# Patient Record
Sex: Male | Born: 1990 | Race: White | Hispanic: No | Smoking: Former smoker
Health system: Southern US, Community
[De-identification: ages and names within clinical notes are randomized; demographics above are authoritative.]

## PROBLEM LIST (undated history)

## (undated) DIAGNOSIS — T7840XA Allergy, unspecified, initial encounter: Secondary | ICD-10-CM

## (undated) HISTORY — DX: Allergy, unspecified, initial encounter: T78.40XA

---
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
## 2012-10-29 ENCOUNTER — Ambulatory Visit (INDEPENDENT_AMBULATORY_CARE_PROVIDER_SITE_OTHER): Payer: BC Managed Care – PPO | Admitting: Physician Assistant

## 2012-10-29 VITALS — BP 122/76 | HR 106 | Temp 98.1°F | Resp 16 | Ht 74.0 in | Wt 138.2 lb

## 2012-10-29 DIAGNOSIS — R42 Dizziness and giddiness: Secondary | ICD-10-CM

## 2012-10-29 NOTE — Progress Notes (Signed)
  Subjective:    Patient ID: Willie Browning, male    DOB: [birth year], 22 y.o.   MRN: [id number]  HPI 22 year old male presents for return to work note. He works at a Therapist, occupational and was working in the heat and felt slightly dizzy like the room was spinning. He sat down and drank a gatorade and ate a pack of crackers and immediately felt better.  He had no LOC, did not pass out, no headache, vision changes, nausea, or vomiting.  He reports complete resolution of symptoms and is asymptomatic now.  His employer sent him home and will not let him return to work until he has a note releasing him.  He is otherwise healthy with no known medical problems.     Review of Systems  Constitutional: Negative for fever and chills.  Gastrointestinal: Negative for nausea and vomiting.  Neurological: Negative for dizziness, syncope, weakness and headaches.       Objective:   Physical Exam  Constitutional: He is oriented to person, place, and time. He appears well-developed and well-nourished.  HENT:  Head: Normocephalic and atraumatic.  Right Ear: External ear normal.  Left Ear: External ear normal.  Eyes: Conjunctivae and EOM are normal. Pupils are equal, round, and reactive to light.  Neck: Normal range of motion. Neck supple.  Cardiovascular: Normal rate, regular rhythm and normal heart sounds.   Pulmonary/Chest: Effort normal and breath sounds normal.  Lymphadenopathy:    He has no cervical adenopathy.  Neurological: He is alert and oriented to person, place, and time.  Psychiatric: He has a normal mood and affect. His behavior is normal. Judgment and thought content normal.          Assessment & Plan:   Dizziness and giddiness  Patient has had complete resolution of symptoms. Dizziness likely due to heat intolerance.  Discussed need to stay hydrated, especially over the next few days with the high temperatures.  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  RTC precautions including return of  dizziness, LOC, vision changes, nausea, or vomiting.

## 2016-04-18 ENCOUNTER — Ambulatory Visit (INDEPENDENT_AMBULATORY_CARE_PROVIDER_SITE_OTHER): Payer: Self-pay | Admitting: Urgent Care

## 2016-04-18 VITALS — BP 110/70 | HR 66 | Temp 98.4°F | Resp 16 | Ht 73.25 in | Wt 161.4 lb

## 2016-04-18 DIAGNOSIS — Z024 Encounter for examination for driving license: Secondary | ICD-10-CM

## 2016-04-18 NOTE — Patient Instructions (Addendum)

## 2016-04-18 NOTE — Progress Notes (Signed)
Commercial Driver Medical Examination   Willie MartRobert L Kinser is a 25 y.o. male who presents today for a DOT physical exam. The patient reports he used to smoke 1ppd. Denies alcohol use. Denies dizziness, chronic headache, blurred vision, chest pain, shortness of breath, heart racing, palpitations, nausea, vomiting, abdominal pain, hematuria, lower leg swelling.   The following portions of the patient's history were reviewed and updated as appropriate: allergies, current medications, past family history, past medical history, past social history and past surgical history.  Objective:   BP 110/70 (BP Location: Right Arm, Cuff Size: Normal)   Pulse 66   Temp 98.4 F (36.9 C) (Oral)   Resp 16   Ht 6' 1.25" (1.861 m)   Wt 161 lb 6.4 oz (73.2 kg)   SpO2 100%   BMI 21.15 kg/m   Vision/hearing:  Visual Acuity Screening   Right eye Left eye Both eyes  Without correction: 20/13 20/13 20/13   With correction:     Comments: The patient can distinguish the colors red, amber and green. Peripheral Vision: Right eye 85 degrees. Left eye 85 degrees.  Hearing Screening Comments: The patient was able to hear a forced whisper from 10 feet.  Patient can recognize and distinguish among traffic control signals and devices showing standard red, green, and amber colors.  Corrective lenses required: No  Monocular Vision?: No  Hearing aid requirement: No  Physical Exam  Constitutional: He is oriented to person, place, and time. He appears well-developed and well-nourished.  HENT:  TM's intact bilaterally, no effusions or erythema. Nasal turbinates pink and moist, nasal passages patent. No sinus tenderness. Oropharynx clear, mucous membranes moist, dentition in good repair.  Eyes: Conjunctivae and EOM are normal. Pupils are equal, round, and reactive to light. Right eye exhibits no discharge. Left eye exhibits no discharge. No scleral icterus.  Neck: Normal range of motion. Neck supple. No thyromegaly  present.  Cardiovascular: Normal rate, regular rhythm and intact distal pulses.  Exam reveals no gallop and no friction rub.   No murmur heard. Pulmonary/Chest: No stridor. No respiratory distress. He has no wheezes. He has no rales.  Abdominal: Soft. Bowel sounds are normal. He exhibits no distension and no mass. There is no tenderness.  Musculoskeletal: Normal range of motion. He exhibits no edema or tenderness.  Lymphadenopathy:    He has no cervical adenopathy.  Neurological: He is alert and oriented to person, place, and time. He has normal reflexes.  Skin: Skin is warm and dry. No rash noted. No erythema. No pallor.  Psychiatric: He has a normal mood and affect.   Labs: Comments: UA Prot. zero; Blood zero;  SG 1.015; Gluc. zero.  Assessment:    Healthy male exam.  Meets standards in 449 CFR 391.41;  qualifies for 2 year certificate.    Plan:   Medical examiners certificate completed and printed. Return as needed.

## 2017-02-12 ENCOUNTER — Emergency Department (HOSPITAL_COMMUNITY)
Admission: EM | Admit: 2017-02-12 | Discharge: 2017-02-13 | Disposition: A | Discharge status: Home / Self Care | Payer: 59 | Attending: Emergency Medicine | Admitting: Emergency Medicine

## 2017-02-12 ENCOUNTER — Emergency Department (HOSPITAL_COMMUNITY): Payer: 59

## 2017-02-12 ENCOUNTER — Encounter (HOSPITAL_COMMUNITY): Payer: Self-pay

## 2017-02-12 DIAGNOSIS — R600 Localized edema: Secondary | ICD-10-CM | POA: Diagnosis present

## 2017-02-12 DIAGNOSIS — L02512 Cutaneous abscess of left hand: Secondary | ICD-10-CM | POA: Insufficient documentation

## 2017-02-12 DIAGNOSIS — Z87891 Personal history of nicotine dependence: Secondary | ICD-10-CM | POA: Insufficient documentation

## 2017-02-12 MED ORDER — BUPIVACAINE HCL (PF) 0.5 % IJ SOLN
10.0000 mL | Freq: Once | INTRAMUSCULAR | Status: AC
  Administered 2017-02-13: 10 mL
  Filled 2017-02-12: qty 30

## 2017-02-12 NOTE — ED Notes (Signed)
Bed: WTR7 Expected date:  Expected time:  Means of arrival:  Comments: 

## 2017-02-12 NOTE — ED Triage Notes (Signed)
Pt complains of left middle finger soreness, he said last year he had metal in there and received antibiotics Pt doesn't think anything is in there this time He states it's swollen and sore and feels infected

## 2017-02-12 NOTE — ED Provider Notes (Signed)
[facility]-EMERGENCY DEPT Provider Note   CSN: [id number] Arrival date & time: 02/12/17  2147     History   Chief Complaint Chief Complaint  Patient presents with  . Hand Pain    HPI Willie Browning is a 26 y.o. male who presents emergency Department with chief complaint of left finger tip infection. Previous history of foreign body in the finger which required incision and drainage. Patient states that this is about 2 years ago. About 3 days ago he began having swelling in the distal part of the finger pad of his left middle finger. He denies any pain with flexion or extension of the finger. He states that it feels that if is going to explode. He has throbbing pain.  HPI  Past Medical History:  Diagnosis Date  . Allergy     There are no active problems to display for this patient.   History reviewed. No pertinent surgical history.     Home Medications    Prior to Admission medications   Not on File    Family History History reviewed. No pertinent family history.  Social History Social History  Substance Use Topics  . Smoking status: Former Games developer  . Smokeless tobacco: Never Used  . Alcohol use No     Allergies   Sulfa antibiotics   Review of Systems Review of Systems  Ten systems reviewed and are negative for acute change, except as noted in the HPI.   Physical Exam Updated Vital Signs BP 134/90 (BP Location: Left Arm)   Pulse (!) 59   Temp 98.4 F (36.9 C) (Oral)   Resp 18   Ht  (1.88 m)   Wt 74.8 kg (165 lb)   SpO2 100%   BMI 21.18 kg/m   Physical Exam  Constitutional: He appears well-developed and well-nourished. No distress.  HENT:  Head: Normocephalic and atraumatic.  Eyes: Conjunctivae are normal. No scleral icterus.  Neck: Normal range of motion. Neck supple.  Cardiovascular: Normal rate, regular rhythm and normal heart sounds.   Pulmonary/Chest: Effort normal and breath sounds normal. No respiratory distress.  Abdominal: Soft.  There is no tenderness.  Musculoskeletal: He exhibits no edema.  Left middle finger with significant swelling, swelling at the distal tip of the finger, tender to palpation,  Neurological: He is alert.  Skin: Skin is warm and dry. He is not diaphoretic.  Psychiatric: His behavior is normal.  Nursing note and vitals reviewed.    ED Treatments / Results  Labs (all labs ordered are listed, but only abnormal results are displayed) Labs Reviewed - No data to display  EKG  EKG Interpretation None       Radiology Dg Finger Middle Left  Result Date: 02/12/2017 CLINICAL DATA:  Pain and swelling of the left third finger today. Piece of metal in the finger about a year ago was not able to be removed. EXAM: LEFT MIDDLE FINGER 2+V COMPARISON:  None. FINDINGS: Left third finger appears intact. No evidence of acute fracture or subluxation. No focal bone lesion or bone destruction. Bone cortex and trabecular architecture appear intact. No radiopaque soft tissue foreign bodies. IMPRESSION: No acute bony abnormalities. No radiopaque soft tissue foreign bodies identified. Electronically Signed   By: Burman Nieves M.D.   On: 02/12/2017 23:16    Procedures Procedures (including critical care time) INCISION AND DRAINAGE Performed by: Arthor Captain Consent: Verbal consent obtained. Risks and benefits: risks, benefits and alternatives were discussed Type: abscess  Body area: Left middle finger  Anesthesia: local infiltration  Incision was made with a scalpel.  MCP block with Marcaine 0.5% without epinephrine   Anesthetic total: 8 ml  Complexity: complex Blunt dissection to break up loculations  Drainage: purulent  Drainage amount: scant  Patient tolerance: Patient tolerated the procedure well with no immediate complications.    Medications Ordered in ED Medications  bupivacaine (MARCAINE) 0.5 % injection 10 mL (not administered)     Initial Impression / Assessment and Plan  / ED Course  I have reviewed the triage vital signs and the nursing notes.  Pertinent labs & imaging results that were available during my care of the patient were reviewed by me and considered in my medical decision making (see chart for details).     Patient with skin abscess amenable to incision and drainage.  Abscess was not large enough to warrant packing or drain,  wound recheck in 2 days. Encouraged home warm soaks and flushing.  Mild signs of cellulitis is surrounding skin.  Will d/c to home.  No antibiotic therapy is indicated.   Final Clinical Impressions(s) / ED Diagnoses   Final diagnoses:  Pulp abscess of finger, left    New Prescriptions New Prescriptions   No medications on file     Arthor Captain, PA-C 02/13/17 0106    Azalia Bilis, MD 02/14/17 [phone]

## 2017-02-13 MED ORDER — CEPHALEXIN 500 MG PO CAPS: ORAL_CAPSULE | ORAL | 0 refills | Status: DC

## 2017-02-13 MED ORDER — TRAMADOL HCL 50 MG PO TABS: 50.0000 mg | ORAL_TABLET | Freq: Four times a day (QID) | ORAL | 0 refills | Status: AC | PRN

## 2017-02-27 ENCOUNTER — Encounter (HOSPITAL_COMMUNITY): Payer: Self-pay

## 2017-02-27 ENCOUNTER — Emergency Department (HOSPITAL_COMMUNITY)
Admission: EM | Admit: 2017-02-27 | Discharge: 2017-02-27 | Disposition: A | Discharge status: Home / Self Care | Payer: 59 | Attending: Emergency Medicine | Admitting: Emergency Medicine

## 2017-02-27 DIAGNOSIS — L02512 Cutaneous abscess of left hand: Secondary | ICD-10-CM | POA: Diagnosis not present

## 2017-02-27 DIAGNOSIS — Z87891 Personal history of nicotine dependence: Secondary | ICD-10-CM | POA: Diagnosis not present

## 2017-02-27 DIAGNOSIS — S6992XS Unspecified injury of left wrist, hand and finger(s), sequela: Secondary | ICD-10-CM

## 2017-02-27 DIAGNOSIS — L819 Disorder of pigmentation, unspecified: Secondary | ICD-10-CM | POA: Diagnosis present

## 2017-02-27 DIAGNOSIS — Z48817 Encounter for surgical aftercare following surgery on the skin and subcutaneous tissue: Secondary | ICD-10-CM | POA: Insufficient documentation

## 2017-02-27 NOTE — ED Triage Notes (Signed)
Patient reports that he was seen 2 weeks ago for a possible infection of the left middle finger. Today the finger is blue/black in color and states he cannot feel the end of his left middle finger.

## 2017-02-27 NOTE — ED Notes (Signed)
[id number] Expected date:  Expected time:  Means of arrival:  Comments: EMS flank pain

## 2017-02-27 NOTE — ED Notes (Signed)
[facility] Expected date:  Expected time:  Means of arrival:  Comments: EMS/flank pain

## 2017-02-27 NOTE — Discharge Instructions (Signed)
If there is risk of getting your finger dirty, keep the area covered with a dressing that is loose. Make sure that you are not keeping a tight dressing on your finger, and continue to move your finger to allow good blood flow. Follow-up with Dr. Melvyn Novasrtmann for further evaluation of your finger. Return to the emergency room if you develop fevers, chills, inability to move your finger, or any new or worsening symptoms.

## 2017-02-27 NOTE — ED Provider Notes (Signed)
[facility]-EMERGENCY DEPT Provider Note   CSN: [id number] Arrival date & time: 02/27/17  1128     History   Chief Complaint No chief complaint on file.   HPI Willie Browning is a 26 y.o. male presenting with color change of left middle finger.  Patient states that 2 weeks ago, he started to develop swelling of the left middle finger. He was seen in emergency room 10/02, where an incision and drainage was performed. He was placed on a week of Keflex, and was having no issues until 3 days ago. On Monday, he started to have some color change of his distal finger. It is dusky/black and white. He denies drainage from the finger. He denies pain, numbness, or difficulty moving his finger. He denies fevers, chills, nausea, or vomiting. He is here for evaluation of the color change. He is not on blood thinners. Denies other medical history. Is not immunocompromised.  He is not a smoker.  Denies history of drug use.  HPI  Past Medical History:  Diagnosis Date  . Allergy     There are no active problems to display for this patient.   History reviewed. No pertinent surgical history.     Home Medications    Prior to Admission medications   Medication Sig Start Date End Date Taking? Authorizing Provider  cephALEXin (KEFLEX) 500 MG capsule 2 caps po bid x 7 days Patient not taking: Reported on 02/27/2017 02/13/17   Arthor CaptainHarris, Abigail, PA-C  traMADol (ULTRAM) 50 MG tablet Take 1 tablet (50 mg total) by mouth every 6 (six) hours as needed. Patient not taking: Reported on 02/27/2017 02/13/17   Arthor CaptainHarris, Abigail, PA-C    Family History History reviewed. No pertinent family history.  Social History Social History  Substance Use Topics  . Smoking status: Former Games developermoker  . Smokeless tobacco: Never Used  . Alcohol use No     Allergies   Sulfa antibiotics   Review of Systems Review of Systems  Skin: Positive for color change.  Allergic/Immunologic: Negative for  immunocompromised state.  Hematological: Does not bruise/bleed easily.     Physical Exam Updated Vital Signs BP 120/82 (BP Location: Left Arm)   Pulse 74   Temp 98.1 F (36.7 C) (Oral)   Resp 18   Ht 6\' 2"  (1.88 m)   Wt 74.8 kg (165 lb)   SpO2 98%   BMI 21.18 kg/m   Physical Exam  Constitutional: He is oriented to person, place, and time. He appears well-developed and well-nourished. No distress.  HENT:  Head: Normocephalic and atraumatic.  Eyes: EOM are normal.  Neck: Normal range of motion.  Pulmonary/Chest: Effort normal.  Abdominal: He exhibits no distension.  Musculoskeletal: Normal range of motion.  Radial pulses intact bilaterally. Strength against resistance intact. Full active range of motion of the finger without pain. No tenderness to palpation. Sensation of lateral distal finger intact. Distal most aspect without sensation.  Neurological: He is alert and oriented to person, place, and time.  Skin: Skin is warm. No rash noted.  Patient with discoloration of distal third right finger. Dusky/black and white skin changes without drainage. Appears to be poor circulation instead of infection. No streaking, warmth, redness. See pictures below  Psychiatric: He has a normal mood and affect.  Nursing note and vitals reviewed.          ED Treatments / Results  Labs (all labs ordered are listed, but only abnormal results are displayed) Labs Reviewed - No  data to display  EKG  EKG Interpretation None       Radiology No results found.  Procedures Procedures (including critical care time)  Medications Ordered in ED Medications - No data to display   Initial Impression / Assessment and Plan / ED Course  I have reviewed the triage vital signs and the nursing notes.  Pertinent labs & imaging results that were available during my care of the patient were reviewed by me and considered in my medical decision making (see chart for details).     Pt  presenting with discoloration of distal third left finger beginning on Monday.  I&D 2 weeks ago, was healing well until Monday.  No new trauma or injury.  No drainage, warmth, or streaking.  Full active range of motion of finger with strength against resistance intact.  Minimal numbness at the distal most aspect of the finger.  Discoloration appears due to vascular etiology, not infectious etiology.  Case discussed with attending, Dr. Erma Heritage evaluated the patient.  Will consult with hand surgery for further evaluation and management of the patient.  Discussed case with Dr. Melvyn Novas with hand surgery, who believes this may be contusion s/p I & D. I voiced my concern that pt has possible vascular compromise, and needs follow up. Pt to f/u in office. No tx or further actions recommended at this time.  Discussed findings and plan with pt. Pt appears safe for discharge. Return precautions given.  Pt states he understands and agrees to plan.   Final Clinical Impressions(s) / ED Diagnoses   Final diagnoses:  Finger injury, left, sequela    New Prescriptions Discharge Medication List as of 02/27/2017  6:46 PM       Alveria Apley, PA-C 03/01/17 2301    Shaune Pollack, MD 03/02/17 1149

## 2018-06-15 ENCOUNTER — Emergency Department (HOSPITAL_BASED_OUTPATIENT_CLINIC_OR_DEPARTMENT_OTHER)
Admission: EM | Admit: 2018-06-15 | Discharge: 2018-06-15 | Disposition: A | Discharge status: Home / Self Care | Payer: 59 | Attending: Emergency Medicine | Admitting: Emergency Medicine

## 2018-06-15 ENCOUNTER — Other Ambulatory Visit: Payer: Self-pay

## 2018-06-15 ENCOUNTER — Encounter (HOSPITAL_BASED_OUTPATIENT_CLINIC_OR_DEPARTMENT_OTHER): Payer: Self-pay | Admitting: Adult Health

## 2018-06-15 DIAGNOSIS — Z87891 Personal history of nicotine dependence: Secondary | ICD-10-CM | POA: Diagnosis not present

## 2018-06-15 DIAGNOSIS — R2232 Localized swelling, mass and lump, left upper limb: Secondary | ICD-10-CM | POA: Diagnosis present

## 2018-06-15 DIAGNOSIS — B0089 Other herpesviral infection: Secondary | ICD-10-CM | POA: Diagnosis not present

## 2018-06-15 DIAGNOSIS — L03022 Acute lymphangitis of left finger: Secondary | ICD-10-CM | POA: Diagnosis not present

## 2018-06-15 MED ORDER — DOXYCYCLINE HYCLATE 100 MG PO CAPS: 100.0000 mg | ORAL_CAPSULE | Freq: Two times a day (BID) | ORAL | 0 refills | Status: DC

## 2018-06-15 MED ORDER — VALACYCLOVIR HCL 1 G PO TABS: 1000.0000 mg | ORAL_TABLET | Freq: Two times a day (BID) | ORAL | 0 refills | Status: DC

## 2018-06-15 MED ORDER — VALACYCLOVIR HCL 1 G PO TABS: 1000.0000 mg | ORAL_TABLET | Freq: Two times a day (BID) | ORAL | 1 refills | Status: AC

## 2018-06-15 NOTE — ED Notes (Signed)
ED Provider at bedside. 

## 2018-06-15 NOTE — ED Provider Notes (Signed)
[facility] EMERGENCY DEPARTMENT Provider Note   CSN: [id number] Arrival date & time: 06/15/18  1118     History   Chief Complaint Chief Complaint  Patient presents with  . Recurrent Skin Infections    HPI Willie Browning is a 28 y.o. male with history of recurrent left middle finger infection who presents with a few day history of swelling, blisters to his left middle finger and red streaking up his arm.  He denies any significant pain.  He has full range of motion of the digit.  He denies any numbness or tingling.  He denies any new injury.  He reports this all started when he got a piece of metal stuck in his finger.  He has had 2 I&D's which did not have good outcomes.  He denies any fevers.  He reports following up with hand surgery and was cleared and told there is nothing else they can do for him.  HPI  Past Medical History:  Diagnosis Date  . Allergy     There are no active problems to display for this patient.   History reviewed. No pertinent surgical history.      Home Medications    Prior to Admission medications   Medication Sig Start Date End Date Taking? Authorizing Provider  cephALEXin (KEFLEX) 500 MG capsule 2 caps po bid x 7 days Patient not taking: Reported on 02/27/2017 02/13/17   Arthor Captain, PA-C  doxycycline (VIBRAMYCIN) 100 MG capsule Take 1 capsule (100 mg total) by mouth 2 (two) times daily. 06/15/18   Azadeh Hyder, Waylan Boga, PA-C  traMADol (ULTRAM) 50 MG tablet Take 1 tablet (50 mg total) by mouth every 6 (six) hours as needed. Patient not taking: Reported on 02/27/2017 02/13/17   Arthor Captain, PA-C  valACYclovir (VALTREX) 1000 MG tablet Take 1 tablet (1,000 mg total) by mouth 2 (two) times daily. 06/15/18   Emi Holes, PA-C    Family History History reviewed. No pertinent family history.  Social History Social History   Tobacco Use  . Smoking status: Former Games developer  . Smokeless tobacco: Never Used  Substance Use Topics  .  Alcohol use: No  . Drug use: No     Allergies   Sulfa antibiotics   Review of Systems Review of Systems  Constitutional: Negative for chills and fever.  HENT: Negative for facial swelling and sore throat.   Respiratory: Negative for shortness of breath.   Cardiovascular: Negative for chest pain.  Gastrointestinal: Negative for abdominal pain, nausea and vomiting.  Genitourinary: Negative for dysuria.  Musculoskeletal: Positive for joint swelling. Negative for back pain.  Skin: Positive for color change and rash. Negative for wound.  Neurological: Negative for headaches.  Psychiatric/Behavioral: The patient is not nervous/anxious.      Physical Exam Updated Vital Signs BP 113/77 (BP Location: Right Arm)   Pulse 73   Temp 98.1 F (36.7 C) (Oral)   Resp 16   Ht 6\' 2"  (1.88 m)   Wt 69.9 kg   SpO2 100%   BMI 19.77 kg/m   Physical Exam Vitals signs and nursing note reviewed.  Constitutional:      General: He is not in acute distress.    Appearance: He is well-developed. He is not diaphoretic.  HENT:     Head: Normocephalic and atraumatic.     Mouth/Throat:     Pharynx: No oropharyngeal exudate.  Eyes:     General: No scleral icterus.       Right  eye: No discharge.        Left eye: No discharge.     Conjunctiva/sclera: Conjunctivae normal.     Pupils: Pupils are equal, round, and reactive to light.  Neck:     Musculoskeletal: Normal range of motion and neck supple.     Thyroid: No thyromegaly.  Cardiovascular:     Rate and Rhythm: Normal rate and regular rhythm.     Heart sounds: Normal heart sounds. No murmur. No friction rub. No gallop.   Pulmonary:     Effort: Pulmonary effort is normal. No respiratory distress.     Breath sounds: Normal breath sounds. No stridor. No wheezing or rales.  Abdominal:     General: Bowel sounds are normal. There is no distension.     Palpations: Abdomen is soft.     Tenderness: There is no abdominal tenderness. There is no  guarding or rebound.  Musculoskeletal:     Comments: Blisters/vesicles noted to the left middle finger, see photo, with associated swelling to the digit, no significant erythema at the digit, but erythema extends in a streaking pattern to the dorsal aspect of the hand back to the mid upper arm medially; full range of motion of the digit, no tenderness on palpation, no involvement of the nail  Lymphadenopathy:     Cervical: No cervical adenopathy.  Skin:    General: Skin is warm and dry.     Coloration: Skin is not pale.     Findings: No rash.  Neurological:     Mental Status: He is alert.     Coordination: Coordination normal.            ED Treatments / Results  Labs (all labs ordered are listed, but only abnormal results are displayed) Labs Reviewed - No data to display  EKG None  Radiology No results found.  Procedures Procedures (including critical care time)  Medications Ordered in ED Medications - No data to display   Initial Impression / Assessment and Plan / ED Course  I have reviewed the triage vital signs and the nursing notes.  Pertinent labs & imaging results that were available during my care of the patient were reviewed by me and considered in my medical decision making (see chart for details).     Patient presenting with recurrent finger infection.  Patient reports he has had resolution with doxycycline in the past.  However, considering the blistering and vesicular appearance, patient appears to have herpetic whitlow with lymphangitis.  Will cover with doxycycline, however add Valtrex.  There are no signs of tenosynovitis or felon at this time.  No signs of septic joint.  Patient is very well-appearing without systemic symptoms.  Area of lymphangitis was marked with skin marker.  Patient advised to return in 2 days for recheck.  Return precautions discussed for reasons to return sooner.  He understands and agrees with plan.  Patient vital stable  throughout ED course and discharged in satisfactory condition. I discussed patient case with Dr. Donnald GarrePfeiffer who guided the patient's management and agrees with plan.   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

## 2018-06-15 NOTE — ED Notes (Signed)
Pt verbalized understanding of dc instructions.

## 2018-06-15 NOTE — Discharge Instructions (Signed)
We suspect you may have a viral infection of your finger so we will cover with antiviral medicine, Valtrex.  We will also cover with doxycycline.  Please return in 2 days for recheck if your symptoms are not beginning to improve.  Please return sooner if you develop any increasing redness, swelling, fevers over 100.4, or any other concerning symptoms.

## 2018-06-15 NOTE — ED Triage Notes (Signed)
Presents with left middle finger redness and swelling that extends into upper arm. He reports this has been a recurrent infection.

## 2019-05-26 IMAGING — CR DG FINGER MIDDLE 2+V*L*
3 series · 3 of 3 positions shown · non-contrast
Comparison: None.

CLINICAL DATA: Pain and swelling of the left third finger today.
Piece of metal in the finger about a year ago was not able to be
removed.

EXAM:
LEFT MIDDLE FINGER 2+V

[x finger pa left]
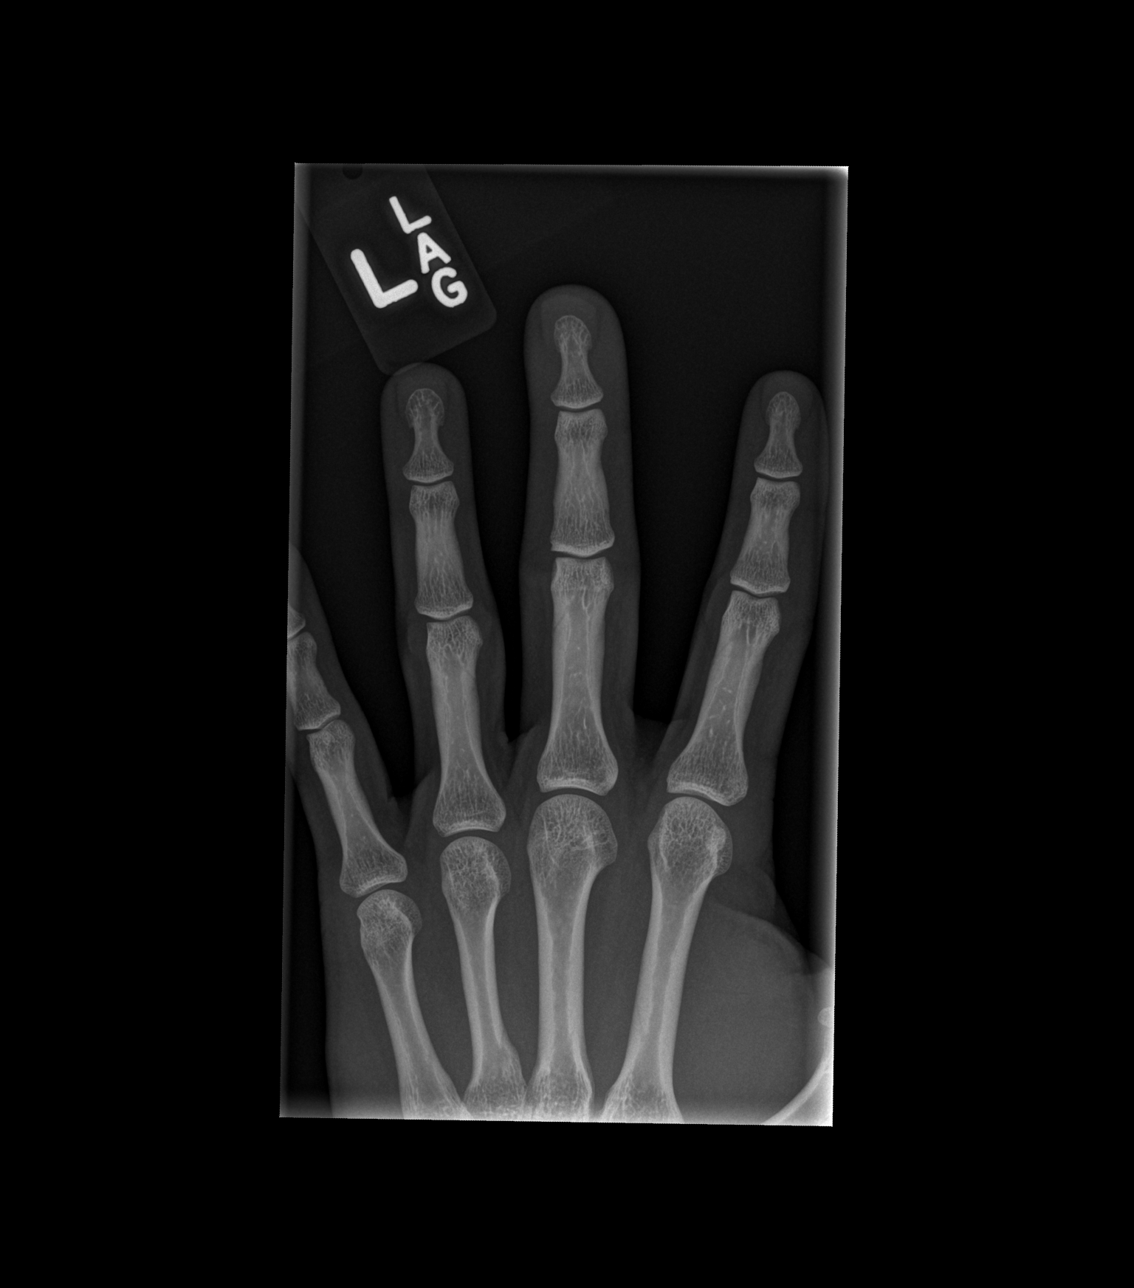

[x finger obl left]
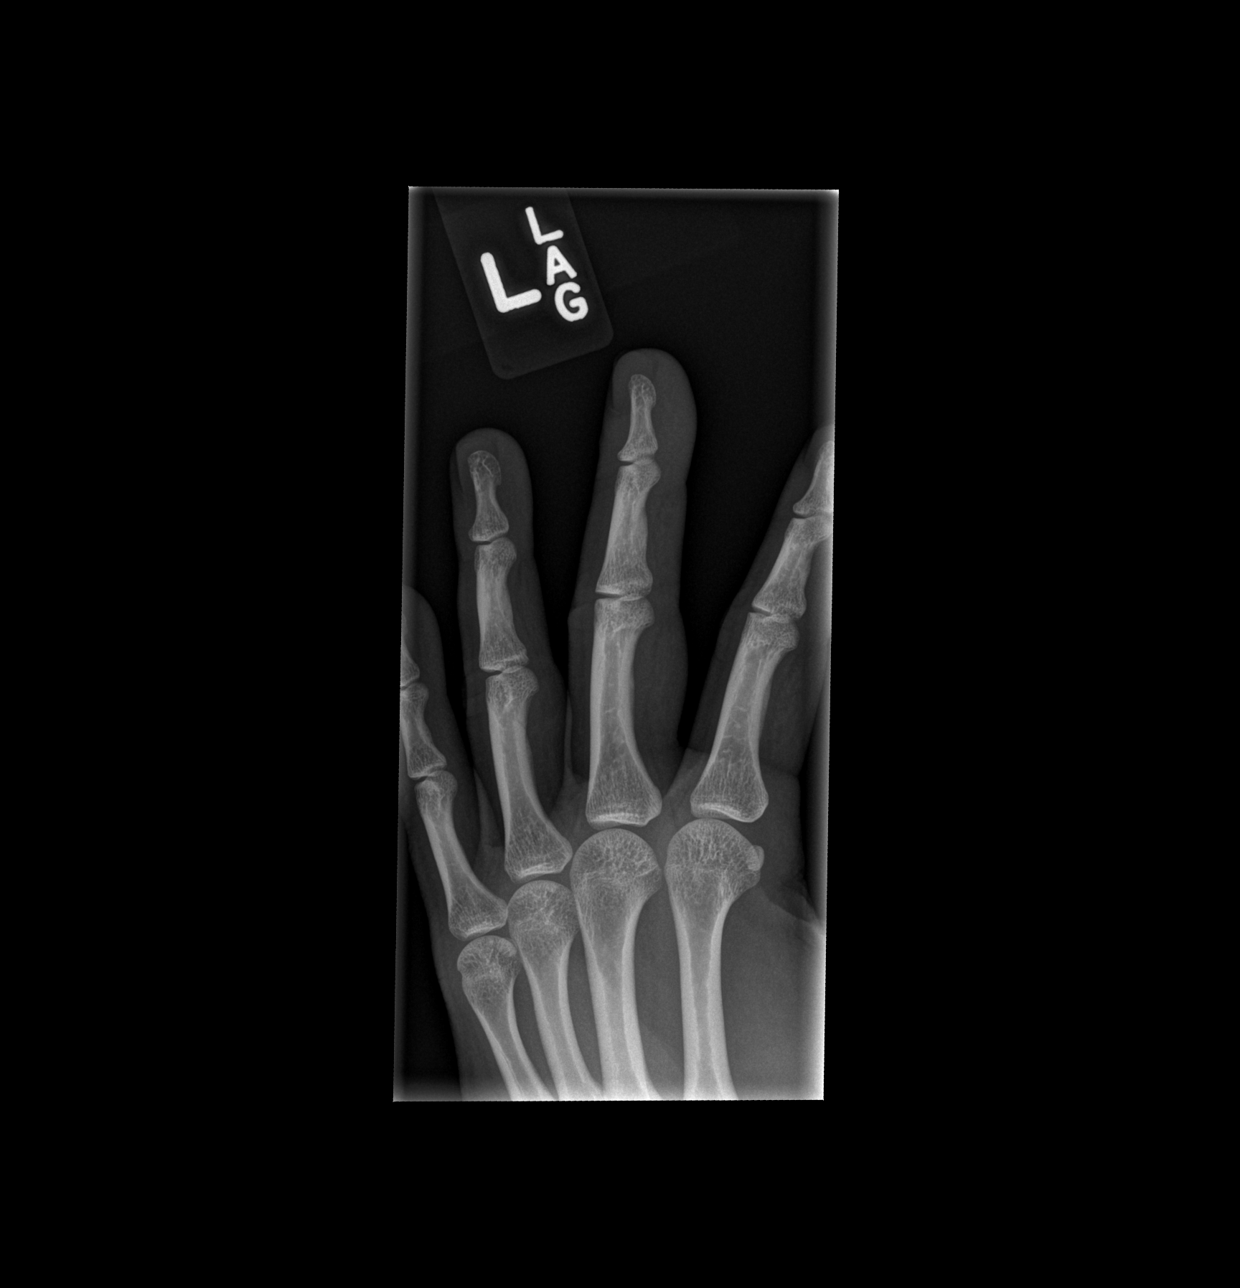

[x finger lat left]
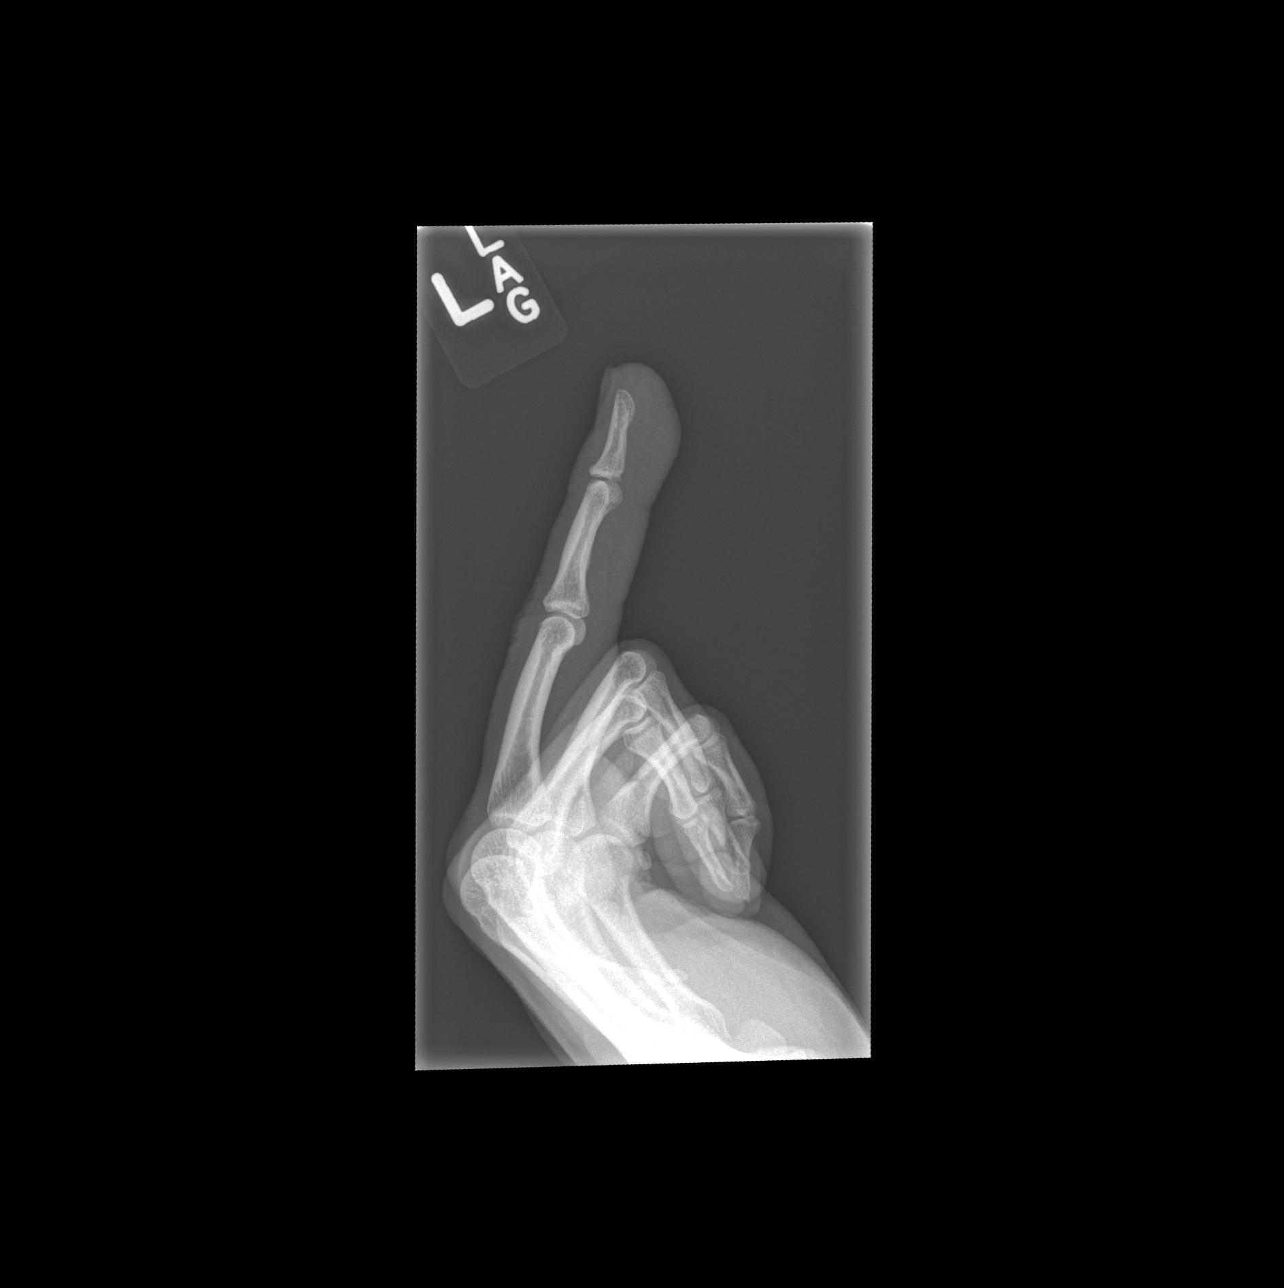

[3 of 3 positions shown; findings below may reference images not displayed]

FINDINGS: Left third finger appears intact. No evidence of acute fracture or
subluxation. No focal bone lesion or bone destruction. Bone cortex
and trabecular architecture appear intact. No radiopaque soft tissue
foreign bodies.
IMPRESSION: No acute bony abnormalities. No radiopaque soft tissue foreign
bodies identified.

## 2020-07-18 ENCOUNTER — Other Ambulatory Visit: Payer: Self-pay

## 2020-07-18 ENCOUNTER — Emergency Department (HOSPITAL_COMMUNITY)
Admission: EM | Admit: 2020-07-18 | Discharge: 2020-07-18 | Disposition: A | Discharge status: Home / Self Care | Payer: 59 | Attending: Emergency Medicine | Admitting: Emergency Medicine

## 2020-07-18 ENCOUNTER — Encounter (HOSPITAL_COMMUNITY): Payer: Self-pay

## 2020-07-18 DIAGNOSIS — S0501XA Injury of conjunctiva and corneal abrasion without foreign body, right eye, initial encounter: Secondary | ICD-10-CM | POA: Diagnosis not present

## 2020-07-18 DIAGNOSIS — Z87891 Personal history of nicotine dependence: Secondary | ICD-10-CM | POA: Insufficient documentation

## 2020-07-18 DIAGNOSIS — X58XXXA Exposure to other specified factors, initial encounter: Secondary | ICD-10-CM | POA: Diagnosis not present

## 2020-07-18 DIAGNOSIS — Y92009 Unspecified place in unspecified non-institutional (private) residence as the place of occurrence of the external cause: Secondary | ICD-10-CM | POA: Insufficient documentation

## 2020-07-18 DIAGNOSIS — S0591XA Unspecified injury of right eye and orbit, initial encounter: Secondary | ICD-10-CM | POA: Diagnosis present

## 2020-07-18 MED ORDER — TETRACAINE HCL 0.5 % OP SOLN
2.0000 [drp] | Freq: Once | OPHTHALMIC | Status: AC
  Administered 2020-07-18: 2 [drp] via OPHTHALMIC
  Filled 2020-07-18: qty 4

## 2020-07-18 MED ORDER — KETOROLAC TROMETHAMINE 0.5 % OP SOLN
1.0000 [drp] | Freq: Once | OPHTHALMIC | Status: AC
  Administered 2020-07-18: 1 [drp] via OPHTHALMIC
  Filled 2020-07-18: qty 5

## 2020-07-18 MED ORDER — TOBRAMYCIN 0.3 % OP SOLN
2.0000 [drp] | Freq: Once | OPHTHALMIC | Status: AC
  Administered 2020-07-18: 2 [drp] via OPHTHALMIC
  Filled 2020-07-18: qty 5

## 2020-07-18 MED ORDER — FLUORESCEIN SODIUM 1 MG OP STRP
1.0000 | ORAL_STRIP | Freq: Once | OPHTHALMIC | Status: AC
  Administered 2020-07-18: 1 via OPHTHALMIC
  Filled 2020-07-18: qty 1

## 2020-07-18 NOTE — ED Triage Notes (Signed)
Pt reports that he was putting flooring down and thinks he got wood in the R eye, redness noted.

## 2020-07-18 NOTE — ED Notes (Signed)
Visual Acuity  L: 20/32 R:20/25

## 2020-07-18 NOTE — Discharge Instructions (Signed)
Use the ketorolac drops, 1 in the right eye 4 times a day for pain.  Use the Tobrex drops, 2, 4 times a day for 2 or 3 days, longer if needed for redness or signs of infection.  See the eye doctor or return here for problems.

## 2020-07-18 NOTE — ED Provider Notes (Signed)
[facility] EMERGENCY DEPARTMENT Provider Note   CSN: [id number] Arrival date & time: 07/18/20  1856     History Chief Complaint  Patient presents with  . Foreign Body in Eye    INRI SOBIESKI is a 30 y.o. male.  HPI He was working with someone at home when he accidentally got something into his right eye.  He washed it out copiously with water and presents now with red and uncomfortable.  He denies blurred vision, loss of vision, headache, weakness or dizziness.  There are no other known modifying factors.    Past Medical History:  Diagnosis Date  . Allergy     There are no problems to display for this patient.   History reviewed. No pertinent surgical history.     No family history on file.  Social History   Tobacco Use  . Smoking status: Former Games developer  . Smokeless tobacco: Never Used  Vaping Use  . Vaping Use: Never used  Substance Use Topics  . Alcohol use: No  . Drug use: No    Home Medications Prior to Admission medications   Medication Sig Start Date End Date Taking? Authorizing Provider  cephALEXin (KEFLEX) 500 MG capsule 2 caps po bid x 7 days Patient not taking: Reported on 02/27/2017 02/13/17   Arthor Captain, PA-C  doxycycline (VIBRAMYCIN) 100 MG capsule Take 1 capsule (100 mg total) by mouth 2 (two) times daily. 06/15/18   Law, Waylan Boga, PA-C  traMADol (ULTRAM) 50 MG tablet Take 1 tablet (50 mg total) by mouth every 6 (six) hours as needed. Patient not taking: Reported on 02/27/2017 02/13/17   Arthor Captain, PA-C  valACYclovir (VALTREX) 1000 MG tablet Take 1 tablet (1,000 mg total) by mouth 2 (two) times daily. 06/15/18   Emi Holes, PA-C    Allergies    Sulfa antibiotics  Review of Systems   Review of Systems  All other systems reviewed and are negative.   Physical Exam Updated Vital Signs BP (!) 142/98 (BP Location: Right Arm)   Pulse (!) 115   Temp 98.7 F (37.1 C) (Oral)   Resp 16   SpO2 100%   Physical  Exam Vitals and nursing note reviewed.  Constitutional:      General: He is not in acute distress.    Appearance: He is well-developed and well-nourished. He is not ill-appearing, toxic-appearing or diaphoretic.  HENT:     Head: Normocephalic and atraumatic.     Right Ear: External ear normal.     Left Ear: External ear normal.  Eyes:     Extraocular Movements: Extraocular movements intact and EOM normal.     Conjunctiva/sclera: Conjunctivae normal.     Pupils: Pupils are equal, round, and reactive to light.     Comments: Mild redness right eye.  No visible foreign body.  Neck:     Trachea: Phonation normal.  Cardiovascular:     Rate and Rhythm: Normal rate.  Pulmonary:     Effort: Pulmonary effort is normal.  Chest:     Chest wall: No bony tenderness.  Abdominal:     General: There is no distension.  Musculoskeletal:        General: Normal range of motion.     Cervical back: Normal range of motion and neck supple.  Skin:    General: Skin is warm, dry and intact.  Neurological:     Mental Status: He is alert and oriented to person, place, and time.  Cranial Nerves: No cranial nerve deficit.     Sensory: No sensory deficit.     Motor: No abnormal muscle tone.     Coordination: Coordination normal.  Psychiatric:        Mood and Affect: Mood and affect and mood normal.        Behavior: Behavior normal.        Thought Content: Thought content normal.        Judgment: Judgment normal.     ED Results / Procedures / Treatments   Labs (all labs ordered are listed, but only abnormal results are displayed) Labs Reviewed - No data to display  EKG None  Radiology No results found.  Procedures Procedures   Medications Ordered in ED Medications  ketorolac (ACULAR) 0.5 % ophthalmic solution 1 drop (has no administration in time range)  tetracaine (PONTOCAINE) 0.5 % ophthalmic solution 2 drop (2 drops Right Eye Given 07/18/20 2239)  fluorescein ophthalmic strip 1 strip  (1 strip Right Eye Given 07/18/20 2239)  tobramycin (TOBREX) 0.3 % ophthalmic solution 2 drop (2 drops Right Eye Given 07/18/20 2256)    ED Course  I have reviewed the triage vital signs and the nursing notes.  Pertinent labs & imaging results that were available during my care of the patient were reviewed by me and considered in my medical decision making (see chart for details).    MDM Rules/Calculators/A&P                           Patient Vitals for the past 24 hrs:  BP Temp Temp src Pulse Resp SpO2  07/18/20 1939 (!) 142/98 98.7 F (37.1 C) Oral (!) 115 16 100 %    10:59 PM Reevaluation with update and discussion. After initial assessment and treatment, an updated evaluation reveals no further complaints, findings discussed and questions answered. Mancel Bale   Medical Decision Making:  This patient is presenting for evaluation of injury to the right eye, which does require a range of treatment options, and is a complaint that involves a moderate risk of morbidity and mortality. The differential diagnoses include foreign body, abrasion, deep tissue injury. I decided to review old records, and in summary patient working at home when he accidentally got some wood into his eye, he washed it out, but has persistent symptoms of foreign body and discomfort.  I did not require additional historical information from anyone.   Critical Interventions-clinical evaluation  Eye procedure-right eye evaluated for injury and foreign body.  Visual acuity obtained, tetracaine instilled, fluorescein instilled.  Inferior scleral abrasion, superficial about 1 x 1.5.  No visible foreign body after instillation of dye, using Woods lamp, and no visualized foreign body using 2X magnification and white light.  After These Interventions, the Patient was reevaluated and was found stable for discharge.  Small abrasion, inferior right eye, without foreign body or significant corneal injury.  CRITICAL  CARE-no Performed by: Mancel Bale  Nursing Notes Reviewed/ Care Coordinated Applicable Imaging Reviewed Interpretation of Laboratory Data incorporated into ED treatment  The patient appears reasonably screened and/or stabilized for discharge and I doubt any other medical condition or other [facility] requiring further screening, evaluation, or treatment in the ED at this time prior to discharge.  Plan: Home Medications-discharged with Tobrex and ketorolac drops, OTC analgesia of choice; Home Treatments-rest; return here if the recommended treatment, does not improve the symptoms; Recommended follow up-PCP, ophthalmology or return here for problems  Final Clinical Impression(s) / ED Diagnoses Final diagnoses:  Abrasion of right cornea, initial encounter    Rx / DC Orders ED Discharge Orders    None       Mancel Bale, MD 07/18/20 2300

## 2022-08-20 ENCOUNTER — Ambulatory Visit
Admission: EM | Admit: 2022-08-20 | Discharge: 2022-08-20 | Disposition: A | Discharge status: Home / Self Care | Payer: BC Managed Care – PPO | Attending: Internal Medicine | Admitting: Internal Medicine

## 2022-08-20 ENCOUNTER — Ambulatory Visit (INDEPENDENT_AMBULATORY_CARE_PROVIDER_SITE_OTHER): Payer: BC Managed Care – PPO

## 2022-08-20 DIAGNOSIS — M79645 Pain in left finger(s): Secondary | ICD-10-CM | POA: Diagnosis not present

## 2022-08-20 MED ORDER — DOXYCYCLINE HYCLATE 100 MG PO CAPS: 100.0000 mg | ORAL_CAPSULE | Freq: Two times a day (BID) | ORAL | 0 refills | Status: AC

## 2022-08-20 NOTE — ED Provider Notes (Signed)
[facility]    CSN: [id number] Arrival date & time: 08/20/22  2563      History   Chief Complaint Chief Complaint  Patient presents with   left finger edema    HPI Willie Browning is a 32 y.o. male.   Patient presents with swelling and pain to left middle finger.  Patient reports he has flareups every few months due to an injury that occurred 6 years ago.  Patient reports that he had a piece of metal wire go through his finger and out the other side.  A few days later, swelling increased significantly so he went to be evaluated.  He reports that they attempted to drain it and then put him on antibiotics.  He states that it flares up with swelling every so often and he has to be put on antibiotics for it.  He reports that he did hit it against a hammer a few days ago so is not sure if this is related.  Denies fever, body aches, chills, drainage from the finger.  Not reporting any numbness or tingling.     Past Medical History:  Diagnosis Date   Allergy     There are no problems to display for this patient.   History reviewed. No pertinent surgical history.     Home Medications    Prior to Admission medications   Medication Sig Start Date End Date Taking? Authorizing Provider  doxycycline (VIBRAMYCIN) 100 MG capsule Take 1 capsule (100 mg total) by mouth 2 (two) times daily for 7 days. 08/20/22 08/27/22 Yes Ellyana Crigler, Acie Fredrickson, FNP  traMADol (ULTRAM) 50 MG tablet Take 1 tablet (50 mg total) by mouth every 6 (six) hours as needed. Patient not taking: Reported on 02/27/2017 02/13/17   Arthor Captain, PA-C  valACYclovir (VALTREX) 1000 MG tablet Take 1 tablet (1,000 mg total) by mouth 2 (two) times daily. 06/15/18   Emi Holes, PA-C    Family History History reviewed. No pertinent family history.  Social History Social History   Tobacco Use   Smoking status: Former   Smokeless tobacco: Never  Building services engineer Use: Never used  Substance Use Topics    Alcohol use: No   Drug use: No     Allergies   Sulfa antibiotics   Review of Systems Review of Systems Per HPI  Physical Exam Triage Vital Signs ED Triage Vitals  Enc Vitals Group     BP 08/20/22 1028 137/83     Pulse Rate 08/20/22 1028 87     Resp 08/20/22 1028 16     Temp 08/20/22 1028 98.2 F (36.8 C)     Temp Source 08/20/22 1028 Oral     SpO2 08/20/22 1028 98 %     Weight --      Height --      Head Circumference --      Peak Flow --      Pain Score 08/20/22 1029 6     Pain Loc --      Pain Edu? --      Excl. in GC? --    No data found.  Updated Vital Signs BP 137/83 (BP Location: Left Arm)   Pulse 87   Temp 98.2 F (36.8 C) (Oral)   Resp 16   SpO2 98%   Visual Acuity Right Eye Distance:   Left Eye Distance:   Bilateral Distance:    Right Eye Near:   Left Eye Near:  Bilateral Near:     Physical Exam Constitutional:      General: He is not in acute distress.    Appearance: Normal appearance. He is not toxic-appearing or diaphoretic.  HENT:     Head: Normocephalic and atraumatic.  Eyes:     Extraocular Movements: Extraocular movements intact.     Conjunctiva/sclera: Conjunctivae normal.  Pulmonary:     Effort: Pulmonary effort is normal.  Skin:    Comments: Patient has pinpoint area present to pad of distal end of left middle finger at lateral portion.  Reports this is where the wire insertion point was. It is closed. There is very minimal swelling surrounding.  No discoloration.  Capillary refill and pulses intact.  Patient has full range of motion of finger. No drainage.   Neurological:     General: No focal deficit present.     Mental Status: He is alert and oriented to person, place, and time. Mental status is at baseline.  Psychiatric:        Mood and Affect: Mood normal.        Behavior: Behavior normal.        Thought Content: Thought content normal.        Judgment: Judgment normal.      UC Treatments / Results  Labs (all  labs ordered are listed, but only abnormal results are displayed) Labs Reviewed - No data to display  EKG   Radiology DG Finger Middle Left  Result Date: 08/20/2022 CLINICAL DATA:  Chronic left middle finger swelling and pain. EXAM: LEFT MIDDLE FINGER 2+V COMPARISON:  Left third finger radiographs 02/12/2017 FINDINGS: Normal bone mineralization. Joint spaces are preserved. The cortices are intact. No acute fracture or dislocation. No cortical erosion or periostitis. IMPRESSION: Normal radiographs of the left middle finger. No significant change from prior. Electronically Signed   By: Neita Garnet M.D.   On: 08/20/2022 10:51    Procedures Procedures (including critical care time)  Medications Ordered in UC Medications - No data to display  Initial Impression / Assessment and Plan / UC Course  I have reviewed the triage vital signs and the nursing notes.  Pertinent labs & imaging results that were available during my care of the patient were reviewed by me and considered in my medical decision making (see chart for details).     X-ray was completed given that he recently hit his hand on a hammer and this was negative for any acute bony abnormality or signs of septic joint.  Low concern for any signs of infection on exam but given patient's history, will opt to treat with doxycycline.  Most likely inflammation due to recent impact injury.  Patient advised to monitor closely for any worsening symptoms and follow-up if they occur.  Patient verbalized understanding and was agreeable with plan. Final Clinical Impressions(s) / UC Diagnoses   Final diagnoses:  Finger pain, left     Discharge Instructions      X-ray was normal.  I have prescribed you an antibiotic to treat any signs of infection.  Monitor closely for any increased redness, swelling, pus and follow-up if symptoms occur.     ED Prescriptions     Medication Sig Dispense Auth. Provider   doxycycline (VIBRAMYCIN) 100 MG  capsule Take 1 capsule (100 mg total) by mouth 2 (two) times daily for 7 days. 14 capsule [place], Acie Fredrickson, [place]      PDMP not reviewed this encounter.   Gustavus Bryant, [place] 08/20/22 1113

## 2022-08-20 NOTE — ED Triage Notes (Signed)
Pt c/o "hitting it the wrong way" referencing the left 3rd digit. Associated edema. States 6 y/a had a wire going through the finger. Since then q 2-3 years gets flares of edema and pain. This is now occurring. Usually tx w/ abx.   Onset ~ 6 years ago.

## 2022-08-20 NOTE — Discharge Instructions (Signed)
X-ray was normal.  I have prescribed you an antibiotic to treat any signs of infection.  Monitor closely for any increased redness, swelling, pus and follow-up if symptoms occur.
